# Patient Record
Sex: Male | Born: 1990 | Race: White | Hispanic: No | Smoking: Current every day smoker
Health system: Southern US, Community
[De-identification: ages and names within clinical notes are randomized; demographics above are authoritative.]

## PROBLEM LIST (undated history)

## (undated) DIAGNOSIS — R55 Syncope and collapse: Secondary | ICD-10-CM

## (undated) DIAGNOSIS — G4730 Sleep apnea, unspecified: Secondary | ICD-10-CM

## (undated) HISTORY — DX: Syncope and collapse: R55

## (undated) HISTORY — PX: OTHER SURGICAL HISTORY: SHX169

---
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
## 2010-01-08 ENCOUNTER — Emergency Department: Payer: Self-pay | Admitting: Emergency Medicine

## 2010-04-29 ENCOUNTER — Emergency Department: Payer: Self-pay | Admitting: Emergency Medicine

## 2010-05-20 ENCOUNTER — Emergency Department: Payer: Self-pay | Admitting: Emergency Medicine

## 2011-02-17 ENCOUNTER — Emergency Department: Payer: Self-pay | Admitting: Emergency Medicine

## 2011-03-18 ENCOUNTER — Emergency Department: Payer: Self-pay | Admitting: Emergency Medicine

## 2011-04-04 ENCOUNTER — Emergency Department: Payer: Self-pay | Admitting: Emergency Medicine

## 2011-04-11 ENCOUNTER — Emergency Department: Payer: Self-pay | Admitting: Emergency Medicine

## 2011-04-20 ENCOUNTER — Emergency Department: Payer: Self-pay | Admitting: Emergency Medicine

## 2011-04-26 ENCOUNTER — Inpatient Hospital Stay: Payer: Self-pay | Admitting: Orthopedic Surgery

## 2011-07-23 ENCOUNTER — Emergency Department: Payer: Self-pay | Admitting: Internal Medicine

## 2011-12-12 ENCOUNTER — Emergency Department: Payer: Self-pay | Admitting: Emergency Medicine

## 2012-10-11 ENCOUNTER — Emergency Department: Payer: Self-pay | Admitting: Emergency Medicine

## 2012-11-30 ENCOUNTER — Emergency Department: Payer: Self-pay | Admitting: Emergency Medicine

## 2013-01-20 ENCOUNTER — Emergency Department: Payer: Self-pay | Admitting: Emergency Medicine

## 2013-07-08 ENCOUNTER — Emergency Department: Payer: Self-pay | Admitting: Emergency Medicine

## 2013-07-09 ENCOUNTER — Emergency Department: Payer: Self-pay | Admitting: Emergency Medicine

## 2013-07-11 LAB — BETA STREP CULTURE(ARMC)

## 2013-09-18 ENCOUNTER — Emergency Department: Payer: Self-pay | Admitting: Emergency Medicine

## 2014-03-27 ENCOUNTER — Emergency Department: Payer: Self-pay | Admitting: Emergency Medicine

## 2014-04-28 ENCOUNTER — Emergency Department: Payer: Self-pay | Admitting: Emergency Medicine

## 2014-06-07 ENCOUNTER — Emergency Department: Payer: Self-pay | Admitting: Emergency Medicine

## 2014-07-01 ENCOUNTER — Emergency Department: Payer: Self-pay | Admitting: Emergency Medicine

## 2014-07-14 ENCOUNTER — Ambulatory Visit (INDEPENDENT_AMBULATORY_CARE_PROVIDER_SITE_OTHER): Payer: Self-pay | Admitting: Cardiovascular Disease

## 2014-07-14 ENCOUNTER — Encounter (INDEPENDENT_AMBULATORY_CARE_PROVIDER_SITE_OTHER): Payer: Self-pay

## 2014-07-14 ENCOUNTER — Encounter: Payer: Self-pay | Admitting: Cardiovascular Disease

## 2014-07-14 VITALS — BP 155/83 | HR 78 | Ht 71.0 in | Wt 315.5 lb

## 2014-07-14 DIAGNOSIS — R55 Syncope and collapse: Secondary | ICD-10-CM | POA: Insufficient documentation

## 2014-07-14 NOTE — Assessment & Plan Note (Signed)
The patient's previous episodes of syncope in the past are highly suggestive of vasovagal syncope. However, the most recent episode was sudden and not preceded by any prodrome or triggering factors. Thus, arrhythmia cannot be excluded. I recommend further evaluation with a 48-hour Holter monitor. I also think it's important to exclude structural heart abnormalities and pulmonary hypertension given his obesity and sleep apnea. I requested an echocardiogram for evaluation.  if Cardiac workup is negative, he might need neurology evaluation with at least an EEG.

## 2014-07-14 NOTE — Progress Notes (Signed)
HPI  This is a 23 year old man who is referred from the emergency room at [facility] for evaluation of syncope. He reports having 2 syncopal episodes in the past years ago but both of them were in the setting of significant pain and discomfort and what preceded with dizziness. However, recently he was walking in his bedroom and all of a sudden he lost consciousness for about 1 minute. There was no seizure activity noted by the wife. He had no incontinence or tongue biting. There was no postictal confusion. The episode was sudden and not preceded by any warning. He had no dizziness, chest pain or shortness of breath. [facility] was [facility]. ECG was unremarkable. He has known history of morbid obesity and sleep apnea on CPAP. There is no history of congenital heart disease. He smokes one pack per day. Family history is negative for coronary artery disease, arrhythmia or sudden death.  Allergies  Allergen Reactions  . Tramadol Itching     No current outpatient prescriptions on file prior to visit.   No current facility-administered medications on file prior to visit.     Past Medical History  Diagnosis Date  . Syncope and collapse      History reviewed. No pertinent past surgical history.   Family History  Problem Relation Age of Onset  . Hypertension Mother   . Hypertension Father      History   Social History  . Marital Status: Married    Spouse Name: N/A    Number of Children: N/A  . Years of Education: N/A   Occupational History  . Not on file.   Social History Main Topics  . Smoking status: Current Every Day Smoker -- 1.00 packs/day for 10 years  . Smokeless tobacco: Not on file  . Alcohol Use: Yes  . Drug Use: No  . Sexual Activity: Not on file   Other Topics Concern  . Not on file   Social History Narrative  . No narrative on file     ROS A 10 point review of system was performed. It is negative other than that mentioned in the history  of present illness.   PHYSICAL EXAM   BP 155/83  Pulse 78  Ht 5\' 11"  (1.803 m)  Wt 315 lb 8 oz (143.11 kg)  BMI 44.02 kg/m2 Constitutional: She is oriented to person, place, and time. She appears well-developed and well-nourished. No distress.  HENT: No nasal discharge.  Head: Normocephalic and atraumatic.  Eyes: Pupils are equal and round. No discharge.  Neck: Normal range of motion. Neck supple. No JVD present. No thyromegaly present.  Cardiovascular: Normal rate, regular rhythm, normal heart sounds. Exam reveals no gallop and no friction rub. No murmur heard.  Pulmonary/Chest: Effort normal and breath sounds normal. No stridor. No respiratory distress. She has no wheezes. She has no rales. She exhibits no tenderness.  Abdominal: Soft. Bowel sounds are normal. She exhibits no distension. There is no tenderness. There is no rebound and no guarding.  Musculoskeletal: Normal range of motion. She exhibits no edema and no tenderness.  Neurological: She is alert and oriented to person, place, and time. Coordination normal.  Skin: Skin is warm and dry. No rash noted. She is not diaphoretic. No erythema. No pallor.  Psychiatric: She has a normal mood and affect. Her behavior is normal. Judgment and thought content normal.     ZOX:WRUEAEKG:Sinus  Rhythm  -Nonspecific QRS widening.   BORDERLINE   ASSESSMENT  AND PLAN

## 2014-07-14 NOTE — Patient Instructions (Signed)
Your physician has recommended that you wear a holter monitor. Holter monitors are medical devices that record the heart's electrical activity. Doctors most often use these monitors to diagnose arrhythmias. Arrhythmias are problems with the speed or rhythm of the heartbeat. The monitor is a small, portable device. You can wear one while you do your normal daily activities. This is usually used to diagnose what is causing palpitations/syncope (passing out).   Your physician has requested that you have an echocardiogram. Echocardiography is a painless test that uses sound waves to create images of your heart. It provides your doctor with information about the size and shape of your heart and how well your heart's chambers and valves are working. This procedure takes approximately one hour. There are no restrictions for this procedure.  Your physician recommends that you schedule a follow-up appointment in:  As needed   Your next appointment will be scheduled in our new office located at :  [facility]  [address], [address]  [place], [place] [id number]

## 2014-07-21 ENCOUNTER — Telehealth: Payer: Self-pay | Admitting: Cardiovascular Disease

## 2014-07-21 ENCOUNTER — Other Ambulatory Visit: Payer: Self-pay

## 2014-07-21 ENCOUNTER — Other Ambulatory Visit (INDEPENDENT_AMBULATORY_CARE_PROVIDER_SITE_OTHER): Payer: Self-pay

## 2014-07-21 ENCOUNTER — Telehealth (HOSPITAL_COMMUNITY): Payer: Self-pay | Admitting: *Deleted

## 2014-07-21 DIAGNOSIS — R55 Syncope and collapse: Secondary | ICD-10-CM

## 2014-07-21 NOTE — Telephone Encounter (Signed)
Wanted to know for the PV that will happen nov19th, can he have coffee, please call pt.

## 2014-07-21 NOTE — Telephone Encounter (Signed)
LVM 10/30 

## 2014-07-24 ENCOUNTER — Telehealth: Payer: Self-pay

## 2014-07-24 NOTE — Telephone Encounter (Signed)
error 

## 2014-07-24 NOTE — Telephone Encounter (Signed)
Pt would like to know echo results. Please call.

## 2014-07-24 NOTE — Telephone Encounter (Signed)
See result note 11/02

## 2014-07-28 ENCOUNTER — Other Ambulatory Visit: Payer: Self-pay

## 2014-08-02 ENCOUNTER — Telehealth: Payer: Self-pay | Admitting: *Deleted

## 2014-08-02 NOTE — Telephone Encounter (Signed)
error 

## 2014-08-02 NOTE — Telephone Encounter (Signed)
Patient has not returned call

## 2014-11-23 ENCOUNTER — Telehealth: Payer: Self-pay | Admitting: *Deleted

## 2014-11-23 NOTE — Telephone Encounter (Signed)
Called pt cell phone is disconnected. Called pt home phone no answer/unable to leave voicemail to verify holter monitor placement.

## 2015-02-01 ENCOUNTER — Emergency Department
Admission: EM | Admit: 2015-02-01 | Discharge: 2015-02-01 | Disposition: A | Discharge status: Home / Self Care | Payer: Self-pay | Attending: Student | Admitting: Student

## 2015-02-01 ENCOUNTER — Encounter: Payer: Self-pay | Admitting: Emergency Medicine

## 2015-02-01 DIAGNOSIS — R252 Cramp and spasm: Secondary | ICD-10-CM | POA: Insufficient documentation

## 2015-02-01 DIAGNOSIS — R1084 Generalized abdominal pain: Secondary | ICD-10-CM | POA: Insufficient documentation

## 2015-02-01 DIAGNOSIS — Z79899 Other long term (current) drug therapy: Secondary | ICD-10-CM | POA: Insufficient documentation

## 2015-02-01 DIAGNOSIS — Z720 Tobacco use: Secondary | ICD-10-CM | POA: Insufficient documentation

## 2015-02-01 DIAGNOSIS — G8929 Other chronic pain: Secondary | ICD-10-CM | POA: Insufficient documentation

## 2015-02-01 HISTORY — DX: Sleep apnea, unspecified: G47.30

## 2015-02-01 LAB — URINALYSIS COMPLETE WITH MICROSCOPIC (ARMC ONLY)
BACTERIA UA: NONE SEEN
Bilirubin Urine: NEGATIVE
GLUCOSE, UA: NEGATIVE mg/dL
Hgb urine dipstick: NEGATIVE
Ketones, ur: NEGATIVE mg/dL
Leukocytes, UA: NEGATIVE
Nitrite: NEGATIVE
PROTEIN: NEGATIVE mg/dL
SPECIFIC GRAVITY, URINE: 1.013 (ref 1.005–1.030)
Squamous Epithelial / LPF: NONE SEEN
pH: 6 (ref 5.0–8.0)

## 2015-02-01 LAB — COMPREHENSIVE METABOLIC PANEL
ALBUMIN: 4.4 g/dL (ref 3.5–5.0)
ALT: 93 U/L — AB (ref 17–63)
AST: 50 U/L — AB (ref 15–41)
Alkaline Phosphatase: 61 U/L (ref 38–126)
Anion gap: 8 (ref 5–15)
BUN: 16 mg/dL (ref 6–20)
CALCIUM: 8.9 mg/dL (ref 8.9–10.3)
CO2: 26 mmol/L (ref 22–32)
Chloride: 103 mmol/L (ref 101–111)
Creatinine, Ser: 0.93 mg/dL (ref 0.61–1.24)
GFR calc Af Amer: >60 mL/min (ref >60)
Glucose, Bld: 120 mg/dL — ABNORMAL HIGH (ref 65–99)
Potassium: 4.2 mmol/L (ref 3.5–5.1)
SODIUM: 137 mmol/L (ref 135–145)
Total Bilirubin: 1.2 mg/dL (ref 0.3–1.2)
Total Protein: 7.6 g/dL (ref 6.5–8.1)

## 2015-02-01 LAB — CBC WITH DIFFERENTIAL/PLATELET
Basophils Absolute: 0 10*3/uL (ref 0–0.1)
Basophils Relative: 0 %
EOS PCT: 3 %
Eosinophils Absolute: 0.2 10*3/uL (ref 0–0.7)
HCT: 42.5 % (ref 40.0–52.0)
Hemoglobin: 15 g/dL (ref 13.0–18.0)
LYMPHS ABS: 2.4 10*3/uL (ref 1.0–3.6)
Lymphocytes Relative: 31 %
MCH: 29.3 pg (ref 26.0–34.0)
MCHC: 35.2 g/dL (ref 32.0–36.0)
MCV: 83.3 fL (ref 80.0–100.0)
MONO ABS: 0.7 10*3/uL (ref 0.2–1.0)
MONOS PCT: 9 %
NEUTROS PCT: 57 %
Neutro Abs: 4.3 10*3/uL (ref 1.4–6.5)
PLATELETS: 289 10*3/uL (ref 150–440)
RBC: 5.11 MIL/uL (ref 4.40–5.90)
RDW: 13 % (ref 11.5–14.5)
WBC: 7.6 10*3/uL (ref 3.8–10.6)

## 2015-02-01 LAB — CK: Total CK: 711 U/L — ABNORMAL HIGH (ref 49–397)

## 2015-02-01 LAB — LIPASE, BLOOD: LIPASE: 27 U/L (ref 22–51)

## 2015-02-01 MED ORDER — DIAZEPAM 5 MG PO TABS
5.0000 mg | ORAL_TABLET | Freq: Once | ORAL | Status: AC
  Administered 2015-02-01: 5 mg via ORAL

## 2015-02-01 MED ORDER — SODIUM CHLORIDE 0.9 % IV BOLUS (SEPSIS)
1000.0000 mL | Freq: Once | INTRAVENOUS | Status: AC
  Administered 2015-02-01: 1000 mL via INTRAVENOUS

## 2015-02-01 MED ORDER — DIAZEPAM 5 MG PO TABS
ORAL_TABLET | ORAL | Status: AC
  Administered 2015-02-01: 5 mg via ORAL
  Filled 2015-02-01: qty 1

## 2015-02-01 MED ORDER — CYCLOBENZAPRINE HCL 5 MG PO TABS: 5.0000 mg | ORAL_TABLET | Freq: Three times a day (TID) | ORAL | Status: AC | PRN

## 2015-02-01 NOTE — Discharge Instructions (Signed)
Abdominal Pain Many things can cause belly (abdominal) pain. Most times, the belly pain is not dangerous. Many cases of belly pain can be watched and treated at home. HOME CARE   Do not take medicines that help you go poop (laxatives) unless told to by your doctor.  Only take medicine as told by your doctor.  Eat or drink as told by your doctor. Your doctor will tell you if you should be on a special diet. GET HELP IF:  You do not know what is causing your belly pain.  You have belly pain while you are sick to your stomach (nauseous) or have runny poop (diarrhea).  You have pain while you pee or poop.  Your belly pain wakes you up at night.  You have belly pain that gets worse or better when you eat.  You have belly pain that gets worse when you eat fatty foods.  You have a fever. GET HELP RIGHT AWAY IF:   The pain does not go away within 2 hours.  You keep throwing up (vomiting).  The pain changes and is only in the right or left part of the belly.  You have bloody or tarry looking poop. MAKE SURE YOU:   Understand these instructions.  Will watch your condition.  Will get help right away if you are not doing well or get worse. Document Released: 02/25/2008 Document Revised: 09/13/2013 Document Reviewed: 05/18/2013 [facility] Patient Information 2015 [place]. This information is not intended to replace advice given to you by your health care provider. Make sure you discuss any questions you have with your health care provider.  Muscle Cramps and Spasms Muscle cramps and spasms occur when a muscle or muscles tighten and you have no control over this tightening (involuntary muscle contraction). They are a common problem and can develop in any muscle. The most common place is in the calf muscles of the leg. Both muscle cramps and muscle spasms are involuntary muscle contractions, but they also have differences:   Muscle cramps are sporadic and painful. They may last a  few seconds to a quarter of an hour. Muscle cramps are often more forceful and last longer than muscle spasms.  Muscle spasms may or may not be painful. They may also last just a few seconds or much longer. CAUSES  It is uncommon for cramps or spasms to be due to a serious underlying problem. In many cases, the cause of cramps or spasms is unknown. Some common causes are:   Overexertion.   Overuse from repetitive motions (doing the same thing over and over).   Remaining in a certain position for a long period of time.   Improper preparation, form, or technique while performing a sport or activity.   Dehydration.   Injury.   Side effects of some medicines.   Abnormally low levels of the salts and ions in your blood (electrolytes), especially potassium and calcium. This could happen if you are taking water pills (diuretics) or you are pregnant.  Some underlying medical problems can make it more likely to develop cramps or spasms. These include, but are not limited to:   Diabetes.   Parkinson disease.   Hormone disorders, such as thyroid problems.   Alcohol abuse.   Diseases specific to muscles, joints, and bones.   Blood vessel disease where not enough blood is getting to the muscles.  HOME CARE INSTRUCTIONS   Stay well hydrated. Drink enough water and fluids to keep your urine clear or  pale yellow.  It may be helpful to massage, stretch, and relax the affected muscle.  For tight or tense muscles, use a warm towel, heating pad, or hot shower water directed to the affected area.  If you are sore or have pain after a cramp or spasm, applying ice to the affected area may relieve discomfort.  Put ice in a plastic bag.  Place a towel between your skin and the bag.  Leave the ice on for 15-20 minutes, 03-04 times a day.  Medicines used to treat a known cause of cramps or spasms may help reduce their frequency or severity. Only take over-the-counter or  prescription medicines as directed by your caregiver. SEEK MEDICAL CARE IF:  Your cramps or spasms get more severe, more frequent, or do not improve over time.  MAKE SURE YOU:   Understand these instructions.  Will watch your condition.  Will get help right away if you are not doing well or get worse. Document Released: 02/28/2002 Document Revised: 01/03/2013 Document Reviewed: 08/25/2012 [facility] Patient Information 2015 [place]. This information is not intended to replace advice given to you by your health care provider. Make sure you discuss any questions you have with your health care provider.

## 2015-02-01 NOTE — ED Provider Notes (Signed)
[facility] Emergency Department Provider Note  ____________________________________________  Time seen: Approximately 9:44 AM  I have reviewed the triage vital signs and the nursing notes.   HISTORY  Chief Complaint Muscle Pain    HPI William Dowaul R Baskett Jr. is a 24 y.o. male with history of obstructive sleep apnea, hypokalemic, who presents for evaluation of acute on chronic abdominal and back cramps. He reports he's had these "my whole life" however they've become more severe over the past 2-3 days. He reports that in the past it has been due to low potassium. Typically the cramps are in the lower abdomen and when he "stretches" they resolve. They've been intermittent. Current severity 5 out of 10. No recent medication changes. He does sometimes engage in strenuous activity for work. He denies any recent illness including no cough, sneezing, runny nose, congestion, nausea, vomiting, diarrhea.   Past Medical History  Diagnosis Date  . Syncope and collapse   . Sleep apnea     Patient Active Problem List   Diagnosis Date Noted  . Faintness 07/14/2014    Past Surgical History  Procedure Laterality Date  . Denies      Current Outpatient Rx  Name  Route  Sig  Dispense  Refill  . Multiple Vitamin (MULTIVITAMIN WITH MINERALS) TABS tablet   Oral   Take 1 tablet by mouth daily.         Marland Kitchen. POTASSIUM PO   Oral   Take 1 tablet by mouth daily.           Allergies Bee venom and Tramadol  Family History  Problem Relation Age of Onset  . Hypertension Mother   . Hypertension Father     Social History History  Substance Use Topics  . Smoking status: Current Every Day Smoker -- 1.00 packs/day for 10 years  . Smokeless tobacco: Not on file  . Alcohol Use: Yes    Review of Systems Constitutional: No fever/chills Eyes: No visual changes. ENT: No sore throat. Cardiovascular: Denies chest pain. Respiratory: Denies shortness of  breath. Gastrointestinal: + abdominal pain.  No nausea, no vomiting.  No diarrhea.  No constipation. Genitourinary: Negative for dysuria. Musculoskeletal: + for back pain. Skin: Negative for rash. Neurological: Negative for headaches, focal weakness or numbness.  10-point ROS otherwise negative.  ____________________________________________   PHYSICAL EXAM:  VITAL SIGNS: ED Triage Vitals  Enc Vitals Group     BP 02/01/15 0756 176/93 mmHg     Pulse Rate 02/01/15 0756 92     Resp 02/01/15 0756 20     Temp 02/01/15 0756 98.1 F (36.7 C)     Temp Source 02/01/15 0756 Oral     SpO2 02/01/15 0756 100 %     Weight --      Height --      Head Cir --      Peak Flow --      Pain Score --      Pain Loc --      Pain Edu? --      Excl. in GC? --     Constitutional: Alert and oriented. Well appearing and in no acute distress. Eyes: Conjunctivae are normal. PERRL. EOMI. Head: Atraumatic. Nose: No congestion/rhinnorhea. Mouth/Throat: Mucous membranes are moist.  Oropharynx non-erythematous. Neck: No stridor. Cardiovascular: Normal rate, regular rhythm. Grossly normal heart sounds.  Good peripheral circulation. Respiratory: Normal respiratory effort.  No retractions. Lungs CTAB. Gastrointestinal: Soft and nontender. No distention. No abdominal bruits. No CVA tenderness.  Genitourinary: deferred Musculoskeletal: No lower extremity tenderness nor edema.  No joint effusions. Neurologic:  Normal speech and language. No gross focal neurologic deficits are appreciated. Speech is normal. No gait instability. Skin:  Skin is warm, dry and intact. No rash noted. Psychiatric: Mood and affect are normal. Speech and behavior are normal.  ____________________________________________   LABS (all labs ordered are listed, but only abnormal results are displayed)  Labs Reviewed  URINALYSIS COMPLETEWITH MICROSCOPIC ([facility])  - Abnormal; Notable for the following:    Color, Urine YELLOW (*)     APPearance CLEAR (*)    All other components within normal limits  COMPREHENSIVE METABOLIC PANEL - Abnormal; Notable for the following:    Glucose, Bld 120 (*)    AST 50 (*)    ALT 93 (*)    All other components within normal limits  CK - Abnormal; Notable for the following:    Total CK 711 (*)    All other components within normal limits  CBC WITH DIFFERENTIAL/PLATELET  LIPASE, BLOOD   ____________________________________________  EKG  none ____________________________________________  RADIOLOGY  none ____________________________________________   PROCEDURES  Procedure(s) performed: None  Critical Care performed: No  ____________________________________________   INITIAL IMPRESSION / ASSESSMENT AND PLAN / ED COURSE  Pertinent labs & imaging results that were available during my care of the patient were reviewed by me and considered in my medical decision making (see chart for details).  William Dowaul R Oleski Jr. is a 24 y.o. male with history of obstructive sleep apnea, hypokalemic, who presents for evaluation of acute on chronic abdominal and back cramps. On exam, he is very well-appearing and in no acute distress. Vital signs stable, afebrile. Blood pressure elevated 176/93 but he is asymptomatic. He has a completely benign abdominal exam. Labs are only remarkable for CK elevation but creatinine is normal. Potassium within normal limits. He feels better after fluids and Valium. We'll discharge with Flexeril, instructions to push by mouth fluids, PCP follow-up. He is comfortable with discharge plan. ____________________________________________   FINAL CLINICAL IMPRESSION(S) / ED DIAGNOSES  Final diagnoses:  Muscle cramps  Generalized abdominal pain      Gayla DossEryka A Neviah Braud, MD 02/01/15 1128

## 2015-02-01 NOTE — ED Notes (Signed)
States he is having right  Sided abd cramping for the past 2 days . denies any n/v /d or fever.The patient has a history of low potassium.

## 2015-03-29 ENCOUNTER — Encounter: Payer: Self-pay | Admitting: Urgent Care

## 2015-03-29 ENCOUNTER — Emergency Department
Admission: EM | Admit: 2015-03-29 | Discharge: 2015-03-29 | Disposition: A | Discharge status: Home / Self Care | Payer: Self-pay | Attending: Emergency Medicine | Admitting: Emergency Medicine

## 2015-03-29 DIAGNOSIS — Z720 Tobacco use: Secondary | ICD-10-CM | POA: Insufficient documentation

## 2015-03-29 DIAGNOSIS — H6091 Unspecified otitis externa, right ear: Secondary | ICD-10-CM

## 2015-03-29 DIAGNOSIS — H60501 Unspecified acute noninfective otitis externa, right ear: Secondary | ICD-10-CM | POA: Insufficient documentation

## 2015-03-29 DIAGNOSIS — Z79899 Other long term (current) drug therapy: Secondary | ICD-10-CM | POA: Insufficient documentation

## 2015-03-29 MED ORDER — HYDROCODONE-ACETAMINOPHEN 5-325 MG PO TABS: 1.0000 | ORAL_TABLET | ORAL | Status: AC | PRN

## 2015-03-29 MED ORDER — AMOXICILLIN 500 MG PO CAPS: 500.0000 mg | ORAL_CAPSULE | Freq: Three times a day (TID) | ORAL | Status: AC

## 2015-03-29 MED ORDER — NEOMYCIN-POLYMYXIN-HC 3.5-10000-1 OT SOLN: 3.0000 [drp] | Freq: Three times a day (TID) | OTIC | Status: AC

## 2015-03-29 NOTE — ED Notes (Signed)
Patient presents with c/o RIGHT otalgia x 4 hours. Wears CPAP  - reports that strap made it unbearable and presented him form sleeping. NOS reported at this time.

## 2015-03-29 NOTE — ED Provider Notes (Signed)
[facility] Emergency Department Provider Note  ____________________________________________  Time seen: 2745 I have reviewed the triage vital signs and the nursing notes.   HISTORY  Chief Complaint Otalgia   HPI William Dowaul R Pohlman Jr. is a 24 y.o. male states his right ear has been hurting for 4 hours prior to his arrival to the emergency room. He states he wears a CPAP machine and the strap last night was putting pressure on his ear which increased his pain causing him not to be able to sleep. He is unaware of any fever or chills. He denies any cough, congestion or runny nose.  He has not taken any over-the-counter medication for his pain. He denies any discharge from his ear. And he has not put anything in his ear. Currently rates his pain as 6/10.   Past Medical History  Diagnosis Date  . Syncope and collapse   . Sleep apnea     Patient Active Problem List   Diagnosis Date Noted  . Faintness 07/14/2014    Past Surgical History  Procedure Laterality Date  . Denies      Current Outpatient Rx  Name  Route  Sig  Dispense  Refill  . amoxicillin (AMOXIL) 500 MG capsule   Oral   Take 1 capsule (500 mg total) by mouth 3 (three) times daily.   30 capsule   0   . cyclobenzaprine (FLEXERIL) 5 MG tablet   Oral   Take 1 tablet (5 mg total) by mouth 3 (three) times daily as needed for muscle spasms (Do NOT drive while taking this medication).   15 tablet   0   . HYDROcodone-acetaminophen (NORCO/VICODIN) 5-325 MG per tablet   Oral   Take 1 tablet by mouth every 4 (four) hours as needed for moderate pain.   15 tablet   0   . Multiple Vitamin (MULTIVITAMIN WITH MINERALS) TABS tablet   Oral   Take 1 tablet by mouth daily.         Marland Kitchen. neomycin-polymyxin-hydrocortisone (CORTISPORIN) otic solution   Right Ear   Place 3 drops into the right ear 3 (three) times daily.   10 mL   0   . POTASSIUM PO   Oral   Take 1 tablet by mouth daily.            Allergies Bee venom and Tramadol  Family History  Problem Relation Age of Onset  . Hypertension Mother   . Hypertension Father     Social History History  Substance Use Topics  . Smoking status: Current Every Day Smoker -- 1.00 packs/day for 10 years  . Smokeless tobacco: Not on file  . Alcohol Use: Yes    Review of Systems Constitutional: No fever/chills Eyes: No visual changes. ENT: No sore throat. Cardiovascular: Denies chest pain. Respiratory: Denies shortness of breath. Gastrointestinal: No abdominal pain.  No nausea, no vomiting.   Genitourinary: Negative for dysuria. Musculoskeletal: Negative for back pain. Skin: Negative for rash. Neurological: Negative for headaches  10-point ROS otherwise negative.  ____________________________________________   PHYSICAL EXAM:  VITAL SIGNS: ED Triage Vitals  Enc Vitals Group     BP 03/29/15 0351 141/67 mmHg     Pulse Rate 03/29/15 0351 76     Resp 03/29/15 0351 18     Temp 03/29/15 0351 98.3 F (36.8 C)     Temp Source 03/29/15 0351 Oral     SpO2 03/29/15 0351 95 %     Weight 03/29/15 0351 325  lb (147.419 kg)     Height 03/29/15 0351  (1.803 m)     Head Cir --      Peak Flow --      Pain Score 03/29/15 0352 6     Pain Loc --      Pain Edu? --      Excl. in GC? --     Constitutional: Alert and oriented. Well appearing and in no acute distress. Eyes: Conjunctivae are normal. PERRL. EOMI. Head: Atraumatic. Nose: No congestion/rhinnorhea. Left EAC and TM clear. Right EAC extremely tender to touch with minimal exudate present. TM is slightly pink. No foreign body was noted Mouth/Throat: Mucous membranes are moist.  Oropharynx non-erythematous. Neck: No stridor.  Supple Hematological/Lymphatic/Immunilogical: No cervical lymphadenopathy. Cardiovascular: Normal rate, regular rhythm. Grossly normal heart sounds.  Good peripheral circulation. Respiratory: Normal respiratory effort.  No retractions. Lungs  CTAB. Gastrointestinal: Soft and nontender. No distention.  Musculoskeletal: No lower extremity tenderness nor edema.  No joint effusions. Neurologic:  Normal speech and language. No gross focal neurologic deficits are appreciated. Speech is normal. No gait instability. Skin:  Skin is warm, dry and intact. No rash noted. Psychiatric: Mood and affect are normal. Speech and behavior are normal.  ____________________________________________   LABS (all labs ordered are listed, but only abnormal results are displayed)  Labs Reviewed - No data to display   PROCEDURES  Procedure(s) performed: None  Critical Care performed: No  ____________________________________________   INITIAL IMPRESSION / ASSESSMENT AND PLAN / ED COURSE  Pertinent labs & imaging results that were available during my care of the patient were reviewed by me and considered in my medical decision making (see chart for details).  She is given prescription for amoxicillin for 10 days along with Norco as needed for pain and Cortisporin otic suspension. He is follow-up with his doctor at [facility] clinic if any continued problems. ____________________________________________   FINAL CLINICAL IMPRESSION(S) / ED DIAGNOSES  Final diagnoses:  Otitis externa, acute, right      William Rumps, PA-C 03/29/15 1005  William Cheek, MD 03/29/15 1536

## 2015-08-17 DIAGNOSIS — Y9389 Activity, other specified: Secondary | ICD-10-CM | POA: Insufficient documentation

## 2015-08-17 DIAGNOSIS — Z792 Long term (current) use of antibiotics: Secondary | ICD-10-CM | POA: Insufficient documentation

## 2015-08-17 DIAGNOSIS — Y998 Other external cause status: Secondary | ICD-10-CM | POA: Insufficient documentation

## 2015-08-17 DIAGNOSIS — W228XXA Striking against or struck by other objects, initial encounter: Secondary | ICD-10-CM | POA: Insufficient documentation

## 2015-08-17 DIAGNOSIS — Y9289 Other specified places as the place of occurrence of the external cause: Secondary | ICD-10-CM | POA: Insufficient documentation

## 2015-08-17 DIAGNOSIS — F17200 Nicotine dependence, unspecified, uncomplicated: Secondary | ICD-10-CM | POA: Insufficient documentation

## 2015-08-17 DIAGNOSIS — Z79899 Other long term (current) drug therapy: Secondary | ICD-10-CM | POA: Insufficient documentation

## 2015-08-17 DIAGNOSIS — S0501XA Injury of conjunctiva and corneal abrasion without foreign body, right eye, initial encounter: Secondary | ICD-10-CM | POA: Insufficient documentation

## 2015-08-17 NOTE — ED Notes (Signed)
Patient reports helping change a tire and it "blew up" in his face.  Reports wash eye out.  Report still feels like something is behind his right eye.

## 2015-08-18 ENCOUNTER — Emergency Department
Admission: EM | Admit: 2015-08-18 | Discharge: 2015-08-18 | Disposition: A | Discharge status: Home / Self Care | Payer: Self-pay | Attending: Emergency Medicine | Admitting: Emergency Medicine

## 2015-08-18 ENCOUNTER — Emergency Department: Payer: Self-pay

## 2015-08-18 DIAGNOSIS — S0501XA Injury of conjunctiva and corneal abrasion without foreign body, right eye, initial encounter: Secondary | ICD-10-CM

## 2015-08-18 DIAGNOSIS — H5711 Ocular pain, right eye: Secondary | ICD-10-CM

## 2015-08-18 MED ORDER — TETRACAINE HCL 0.5 % OP SOLN
OPHTHALMIC | Status: AC
  Filled 2015-08-18: qty 2

## 2015-08-18 MED ORDER — ERYTHROMYCIN 5 MG/GM OP OINT
TOPICAL_OINTMENT | Freq: Once | OPHTHALMIC | Status: AC
  Administered 2015-08-18: 1 via OPHTHALMIC
  Filled 2015-08-18: qty 1

## 2015-08-18 MED ORDER — OXYCODONE-ACETAMINOPHEN 5-325 MG PO TABS
2.0000 | ORAL_TABLET | Freq: Once | ORAL | Status: AC
  Administered 2015-08-18: 2 via ORAL
  Filled 2015-08-18: qty 2

## 2015-08-18 MED ORDER — FLUORESCEIN SODIUM 1 MG OP STRP
ORAL_STRIP | OPHTHALMIC | Status: AC
  Filled 2015-08-18: qty 1

## 2015-08-18 MED ORDER — TETRACAINE HCL 0.5 % OP SOLN: 1.0000 [drp] | Freq: Once | OPHTHALMIC | Status: DC

## 2015-08-18 MED ORDER — FLUORESCEIN SODIUM 1 MG OP STRP
1.0000 | ORAL_STRIP | Freq: Once | OPHTHALMIC | Status: DC
  Filled 2015-08-18: qty 1

## 2015-08-18 NOTE — Discharge Instructions (Signed)
Overall, you're examining the emergency department looks okay. You had a minimal abrasion at approximately the 4:00 position on your eye. The CT scan of your eyes looked okay with no foreign bodies and no bleeding in the area.  Further exam is recommended. Please contact [facility] or call back to the emergency department to help arrange this follow-up on Saturday morning. We will call Dr. Druscilla BrowniePorfilio to help arrange this as well. This often happens at approximately 8:30 or 9:00 in the morning. Please keep a telephone on so that we can contact you with an updated time. Return to the emergency department if you have worsening pain, worsening vision, or if you have other urgent concerns.  Corneal Abrasion The cornea is the clear covering at the front and center of the eye. When looking at the colored portion of the eye (iris), you are looking through the cornea. This very thin tissue is made up of many layers. The surface layer is a single layer of cells (corneal epithelium) and is one of the most sensitive tissues in the body. If a scratch or injury causes the corneal epithelium to come off, it is called a corneal abrasion. If the injury extends to the tissues below the epithelium, the condition is called a corneal ulcer. CAUSES   Scratches.  Trauma.  Foreign body in the eye. Some people have recurrences of abrasions in the area of the original injury even after it has healed (recurrent erosion syndrome). Recurrent erosion syndrome generally improves and goes away with time. SYMPTOMS   Eye pain.  Difficulty or inability to keep the injured eye open.  The eye becomes very sensitive to light.  Recurrent erosions tend to happen suddenly, first thing in the morning, usually after waking up and opening the eye. DIAGNOSIS  Your health care provider can diagnose a corneal abrasion during an eye exam. Dye is usually placed in the eye using a drop or a small paper strip moistened by your tears.  When the eye is examined with a special light, the abrasion shows up clearly because of the dye. TREATMENT   Small abrasions may be treated with antibiotic drops or ointment alone.  A pressure patch may be put over the eye. If this is done, follow your doctor's instructions for when to remove the patch. Do not drive or use machines while the eye patch is on. Judging distances is hard to do with a patch on. If the abrasion becomes infected and spreads to the deeper tissues of the cornea, a corneal ulcer can result. This is serious because it can cause corneal scarring. Corneal scars interfere with light passing through the cornea and cause a loss of vision in the involved eye. HOME CARE INSTRUCTIONS  Use medicine or ointment as directed. Only take over-the-counter or prescription medicines for pain, discomfort, or fever as directed by your health care provider.  Do not drive or operate machinery if your eye is patched. Your ability to judge distances is impaired.  If your health care provider has given you a follow-up appointment, it is very important to keep that appointment. Not keeping the appointment could result in a severe eye infection or permanent loss of vision. If there is any problem keeping the appointment, let your health care provider know. SEEK MEDICAL CARE IF:   You have pain, light sensitivity, and a scratchy feeling in one eye or both eyes.  Your pressure patch keeps loosening up, and you can blink your eye under the patch  after treatment.  Any kind of discharge develops from the eye after treatment or if the lids stick together in the morning.  You have the same symptoms in the morning as you did with the original abrasion days, weeks, or months after the abrasion healed.   This information is not intended to replace advice given to you by your health care provider. Make sure you discuss any questions you have with your health care provider.   Document Released: 09/05/2000  Document Revised: 05/30/2015 Document Reviewed: 05/16/2013 Elsevier Interactive Patient Education [employer]

## 2015-08-18 NOTE — ED Notes (Signed)
During discharge, pt was under the impression he was getting an abx eyedrop prescription.  Inquiring with Carollee MassedKaminski.

## 2015-08-18 NOTE — ED Notes (Addendum)
Pt states he did wash out his eyes with water first then he did it again with 98% "purified water".  He feels like there is something behind his eye, he says it feels like "pressure" behind his eye building up.

## 2015-08-18 NOTE — ED Provider Notes (Signed)
[facility] Emergency Department Provider Note  ____________________________________________  Time seen: 223  I have reviewed the triage vital signs and the nursing notes.  History by:  Patient  HISTORY  Chief Complaint Eye Pain and Foreign Body in Eye     HPI William Dowaul R Cummings Jr. is a 24 y.o. male who was working with a pickup tire earlier this evening. He reports the tire exploded and he fell he had debris hit his right eye. He continues to have ongoing eye discomfort. He reports some blurry vision, but cites that he thinks that is due to the increased watering. He did wash his eye out but feels that the discomfort is continuing and he reports he has a pressure in the eye.    Past Medical History  Diagnosis Date  . Syncope and collapse   . Sleep apnea     Patient Active Problem List   Diagnosis Date Noted  . Faintness 07/14/2014    Past Surgical History  Procedure Laterality Date  . Denies      Current Outpatient Rx  Name  Route  Sig  Dispense  Refill  . amoxicillin (AMOXIL) 500 MG capsule   Oral   Take 1 capsule (500 mg total) by mouth 3 (three) times daily.   30 capsule   0   . cyclobenzaprine (FLEXERIL) 5 MG tablet   Oral   Take 1 tablet (5 mg total) by mouth 3 (three) times daily as needed for muscle spasms (Do NOT drive while taking this medication).   15 tablet   0   . HYDROcodone-acetaminophen (NORCO/VICODIN) 5-325 MG per tablet   Oral   Take 1 tablet by mouth every 4 (four) hours as needed for moderate pain.   15 tablet   0   . Multiple Vitamin (MULTIVITAMIN WITH MINERALS) TABS tablet   Oral   Take 1 tablet by mouth daily.         Marland Kitchen. POTASSIUM PO   Oral   Take 1 tablet by mouth daily.           Allergies Bee venom and Tramadol  Family History  Problem Relation Age of Onset  . Hypertension Mother   . Hypertension Father     Social History Social History  Substance Use Topics  . Smoking status: Current  Every Day Smoker -- 1.00 packs/day for 10 years  . Smokeless tobacco: Not on file  . Alcohol Use: Yes    Review of Systems  Constitutional: Negative for fever/chills. ENT: Eye injury. She history of present illness. Cardiovascular: Negative for chest pain. Respiratory: Negative for cough. Gastrointestinal: Negative for abdominal pain, vomiting and diarrhea. Genitourinary: Negative for dysuria. Musculoskeletal: No myalgias or injuries. Skin: Negative for rash. Neurological: Negative for headache or focal weakness   10-point ROS otherwise negative.  ____________________________________________   PHYSICAL EXAM:  VITAL SIGNS: ED Triage Vitals  Enc Vitals Group     BP 08/17/15 2339 146/68 mmHg     Pulse Rate 08/17/15 2339 86     Resp 08/17/15 2339 20     Temp 08/17/15 2339 97.5 F (36.4 C)     Temp Source 08/17/15 2339 Oral     SpO2 08/17/15 2339 97 %     Weight 08/17/15 2339 325 lb (147.419 kg)     Height 08/17/15 2339 5\' 11"  (1.803 m)     Head Cir --      Peak Flow --      Pain Score 08/17/15 2337  7     Pain Loc --      Pain Edu? --      Excl. in GC? --     Constitutional: Alert and oriented. Patient appears a little comfortable due to his right eye, but no acute distress. ENT   Head: Normocephalic and atraumatic.   Nose: No congestion/rhinnorhea.       Mouth: No erythema, no swelling        Eye: Minimal injection with mild tearing. No deformity. No strabismus. No hyphema noted.      Lamp exam with florescence shows minimal abrasion at the 4:00 position just under the iris.       ocular movement appears normal. Cardiovascular: Normal rate, regular rhythm, no murmur noted Respiratory:  Normal respiratory effort, no tachypnea.    Breath sounds are clear and equal bilaterally.  Gastrointestinal: Soft and nontender. No distention.  Musculoskeletal: No deformity noted. Nontender with normal range of motion in all extremities.  No noted edema. Neurologic:   Communicative. Normal appearing spontaneous movement in all 4 extremities. No gross focal neurologic deficits are appreciated.  Skin:  Skin is warm, dry. No rash noted. Psychiatric: Mood and affect are normal. Speech and behavior are normal.  ____________________________________________  Radiology:  CT orbits:  IMPRESSION: Orbital structures appear intact and symmetrical. No radiopaque foreign bodies demonstrated. ____________________________________________   PROCEDURES  Slit lamp exam: Due to the patient's mechanism injury, with tire exploding and causing eye pain, a slit-lamp exam was performed.  The patient was pretreated with tetracaine. This helped control the pain he was having. On exam with slit-lamp, there was a minimal defect noted at approximately the 4:00 position. Fluoresceined eye was then added. No additional ulcerations were seen.  ____________________________________________   INITIAL IMPRESSION / ASSESSMENT AND PLAN / ED COURSE  Pertinent labs & imaging results that were available during my care of the patient were reviewed by me and considered in my medical decision making (see chart for details).  Communicative, overall well-appearing, 24 year old male with some discomfort from the tire exploding and affecting his right eye. Slit lamp exam shows minimal abrasion that likely does not fully explain the patient's discomfort. Given the explosive nature of this event, we will perform a CT scan to be sure that he does not have a foreign body present and that he does not have any bleeding around the right eye. If this all looks normal, the patient appears stable and can be discharged for close follow-up with ophthalmology.   ----------------------------------------- 4:18 AM on 08/18/2015 -----------------------------------------  CT does not show any area of bleeding and does not see any radiopaque foreign bodies. We will discharge the patient follow-up with [facility].  ____________________________________________   FINAL CLINICAL IMPRESSION(S) / ED DIAGNOSES  Final diagnoses:  Acute right eye pain  Corneal abrasion, right, initial encounter      Darien Ramus, MD 08/18/15 0425

## 2015-08-18 NOTE — ED Notes (Signed)
Patient transported to CT 

## 2017-03-22 ENCOUNTER — Emergency Department: Payer: Self-pay

## 2017-03-22 ENCOUNTER — Emergency Department
Admission: EM | Admit: 2017-03-22 | Discharge: 2017-03-22 | Disposition: A | Discharge status: Home / Self Care | Payer: Self-pay | Attending: Student in an Organized Health Care Education/Training Program | Admitting: Student in an Organized Health Care Education/Training Program

## 2017-03-22 DIAGNOSIS — Y9301 Activity, walking, marching and hiking: Secondary | ICD-10-CM | POA: Insufficient documentation

## 2017-03-22 DIAGNOSIS — Y9289 Other specified places as the place of occurrence of the external cause: Secondary | ICD-10-CM | POA: Insufficient documentation

## 2017-03-22 DIAGNOSIS — Y999 Unspecified external cause status: Secondary | ICD-10-CM | POA: Insufficient documentation

## 2017-03-22 DIAGNOSIS — W458XXA Other foreign body or object entering through skin, initial encounter: Secondary | ICD-10-CM | POA: Insufficient documentation

## 2017-03-22 DIAGNOSIS — Z79899 Other long term (current) drug therapy: Secondary | ICD-10-CM | POA: Insufficient documentation

## 2017-03-22 DIAGNOSIS — Z23 Encounter for immunization: Secondary | ICD-10-CM | POA: Insufficient documentation

## 2017-03-22 DIAGNOSIS — F17210 Nicotine dependence, cigarettes, uncomplicated: Secondary | ICD-10-CM | POA: Insufficient documentation

## 2017-03-22 DIAGNOSIS — S91332A Puncture wound without foreign body, left foot, initial encounter: Secondary | ICD-10-CM | POA: Insufficient documentation

## 2017-03-22 MED ORDER — AMOXICILLIN-POT CLAVULANATE 875-125 MG PO TABS: 1.0000 | ORAL_TABLET | Freq: Two times a day (BID) | ORAL | 0 refills | Status: AC

## 2017-03-22 MED ORDER — TETANUS-DIPHTH-ACELL PERTUSSIS 5-2.5-18.5 LF-MCG/0.5 IM SUSP
0.5000 mL | Freq: Once | INTRAMUSCULAR | Status: AC
  Administered 2017-03-22: 0.5 mL via INTRAMUSCULAR
  Filled 2017-03-22: qty 0.5

## 2017-03-22 MED ORDER — AMOXICILLIN-POT CLAVULANATE 875-125 MG PO TABS
1.0000 | ORAL_TABLET | Freq: Once | ORAL | Status: AC
  Administered 2017-03-22: 1 via ORAL
  Filled 2017-03-22: qty 1

## 2017-03-22 NOTE — ED Triage Notes (Signed)
Patient reports stepped on a screw with left foot, now with pain while ambulating.

## 2017-03-22 NOTE — ED Provider Notes (Signed)
[facility] Emergency Department Provider Note  ____________________________________________  Time seen: Approximately 9:33 PM  I have reviewed the triage vital signs and the nursing notes.   HISTORY  Chief Complaint Foot Pain    HPI William Rogers. is a 26 y.o. male who presents to emergency department complaining of a puncture wound to his left foot. Patient reports that he was walking through his yard with her tennis shoes when he stepped on a bolt or screw. Patient reports that it did penetrate through his shoe into the bottom of his left foot. Patient is reporting pain to the region. He is unsure of his last tetanus shot. No other injury or complaint.   Past Medical History:  Diagnosis Date  . Sleep apnea   . Syncope and collapse     Patient Active Problem List   Diagnosis Date Noted  . Faintness 07/14/2014    Past Surgical History:  Procedure Laterality Date  . denies      Prior to Admission medications   Medication Sig Start Date End Date Taking? Authorizing Provider  amoxicillin (AMOXIL) 500 MG capsule Take 1 capsule (500 mg total) by mouth 3 (three) times daily. 03/29/15   [name], PA-C  amoxicillin-clavulanate (AUGMENTIN) 875-125 MG tablet Take 1 tablet by mouth 2 (two) times daily. 03/22/17   Malai Lady, Delorise Royals, PA-C  cyclobenzaprine (FLEXERIL) 5 MG tablet Take 1 tablet (5 mg total) by mouth 3 (three) times daily as needed for muscle spasms (Do NOT drive while taking this medication). 02/01/15   Gayla Doss, MD  HYDROcodone-acetaminophen (NORCO/VICODIN) 5-325 MG per tablet Take 1 tablet by mouth every 4 (four) hours as needed for moderate pain. 03/29/15   [name], PA-C  Multiple Vitamin (MULTIVITAMIN WITH MINERALS) TABS tablet Take 1 tablet by mouth daily.    [provider]  POTASSIUM PO Take 1 tablet by mouth daily.    [provider]    Allergies Bee venom and Tramadol  Family History   Problem Relation Age of Onset  . Hypertension Mother   . Hypertension Father     Social History Social History  Substance Use Topics  . Smoking status: Current Every Day Smoker    Packs/day: 1.00    Years: 10.00  . Smokeless tobacco: Not on file  . Alcohol use Yes     Review of Systems  Constitutional: No fever/chills Cardiovascular: no chest pain. Respiratory: no cough. No SOB. Musculoskeletal: Negative for musculoskeletal pain. Skin: Negative for rash, abrasions, lacerations, ecchymosis. Positive for puncture wound to the left foot Neurological: Negative for headaches, focal weakness or numbness. 10-point ROS otherwise negative.  ____________________________________________   PHYSICAL EXAM:  VITAL SIGNS: ED Triage Vitals  Enc Vitals Group     BP 03/22/17 1926 (!) 147/76     Pulse Rate 03/22/17 1926 70     Resp 03/22/17 1926 20     Temp 03/22/17 1926 98.7 F (37.1 C)     Temp Source 03/22/17 1926 Oral     SpO2 03/22/17 1926 97 %     Weight 03/22/17 1927 (!) 315 lb (142.9 kg)     Height 03/22/17 1927 5\' 11"  (1.803 m)     Head Circumference --      Peak Flow --      Pain Score --      Pain Loc --      Pain Edu? --      Excl. in GC? --  Constitutional: Alert and oriented. Well appearing and in no acute distress. Eyes: Conjunctivae are normal. PERRL. EOMI. Head: Atraumatic. ENT:      Ears:       Nose: No congestion/rhinnorhea.      Mouth/Throat: Mucous membranes are moist.  Neck: No stridor.    Cardiovascular: Normal rate, regular rhythm. Normal S1 and S2.  Good peripheral circulation. Respiratory: Normal respiratory effort without tachypnea or retractions. Lungs CTAB. Good air entry to the bases with no decreased or absent breath sounds. Musculoskeletal: Full range of motion to all extremities. No gross deformities appreciated.No visible deformity or gross edema noted to left foot upon direction. Puncture wound is noted to the plantar aspect of the  left foot. No visible foreign body. No bleeding. Area is tender to palpation. Dorsalis pedis pulse intact. Sensation intact 5 digits. Neurologic:  Normal speech and language. No gross focal neurologic deficits are appreciated.  Skin:  Skin is warm, dry and intact. No rash noted. See above note for puncture wound description to left foot. Psychiatric: Mood and affect are normal. Speech and behavior are normal. Patient exhibits appropriate insight and judgement.   ____________________________________________   LABS (all labs ordered are listed, but only abnormal results are displayed)  Labs Reviewed - No data to display ____________________________________________  EKG   ____________________________________________  RADIOLOGY Festus Barren Shahd Occhipinti, personally viewed and evaluated these images (plain radiographs) as part of my medical decision making, as well as reviewing the written report by the radiologist.  Dg Foot Complete Left  Result Date: 03/22/2017 CLINICAL DATA:  Left foot pain after stepping on a screw. Puncture on plantar foot. EXAM: LEFT FOOT - COMPLETE 3+ VIEW COMPARISON:  None. FINDINGS: There is no evidence of fracture or dislocation. There is no evidence of arthropathy or other focal bone abnormality. Site of puncture wound is not well seen radiographically. No radiopaque foreign body. No tracking soft tissue air. IMPRESSION: No acute osseous abnormality. Site of puncture wound not well seen radiographically. No radiopaque foreign body. Electronically Signed   By: Rubye Oaks M.D.   On: 03/22/2017 21:20    ____________________________________________    PROCEDURES  Procedure(s) performed:    Procedures    Medications  amoxicillin-clavulanate (AUGMENTIN) 875-125 MG per tablet 1 tablet (1 tablet Oral Given 03/22/17 2148)  Tdap (BOOSTRIX) injection 0.5 mL (0.5 mLs Intramuscular Given 03/22/17 2147)      ____________________________________________   INITIAL IMPRESSION / ASSESSMENT AND PLAN / ED COURSE  Pertinent labs & imaging results that were available during my care of the patient were reviewed by me and considered in my medical decision making (see chart for details).  Review of the [place] CSRS was performed in accordance of the [facility] prior to dispensing any controlled drugs.     Patient's diagnosis is consistent with puncture wound to the left foot. X-ray reveals no retained foreign body. Exam is reassuring with no indication of foreign body. Wound care structures are given the patient. He'll be placed on antibiotics prophylactically. Tetanus shot is updated at this time.. Patient will be discharged home with prescriptions for Augmentin. Patient is to follow up with primary care as needed or otherwise directed. Patient is given ED precautions to return to the ED for any worsening or new symptoms.     ____________________________________________  FINAL CLINICAL IMPRESSION(S) / ED DIAGNOSES  Final diagnoses:  Puncture wound of left foot, initial encounter      NEW MEDICATIONS STARTED DURING THIS VISIT:  Discharge Medication List as of  03/22/2017  9:43 PM    START taking these medications   Details  amoxicillin-clavulanate (AUGMENTIN) 875-125 MG tablet Take 1 tablet by mouth 2 (two) times daily., Starting Sun 03/22/2017, Print            This chart was dictated using voice recognition software/Dragon. Despite best efforts to proofread, errors can occur which can change the meaning. Any change was purely unintentional.    Racheal PatchesCuthriell, Aviannah Castoro D, PA-C 03/22/17 2228    Willy Eddyobinson, Patrick, MD 03/23/17 [id number]

## 2019-08-24 ENCOUNTER — Other Ambulatory Visit: Payer: Self-pay

## 2019-08-24 DIAGNOSIS — Z20822 Contact with and (suspected) exposure to covid-19: Secondary | ICD-10-CM

## 2019-08-27 LAB — NOVEL CORONAVIRUS, NAA: SARS-CoV-2, NAA: NOT DETECTED

## 2020-04-19 ENCOUNTER — Ambulatory Visit: Payer: Self-pay

## 2020-12-31 ENCOUNTER — Other Ambulatory Visit: Payer: Self-pay

## 2020-12-31 ENCOUNTER — Emergency Department: Payer: Self-pay

## 2020-12-31 ENCOUNTER — Emergency Department
Admission: EM | Admit: 2020-12-31 | Discharge: 2020-12-31 | Disposition: A | Discharge status: Home / Self Care | Payer: Self-pay | Attending: Emergency Medicine | Admitting: Emergency Medicine

## 2020-12-31 DIAGNOSIS — Y9389 Activity, other specified: Secondary | ICD-10-CM | POA: Insufficient documentation

## 2020-12-31 DIAGNOSIS — S61511A Laceration without foreign body of right wrist, initial encounter: Secondary | ICD-10-CM | POA: Insufficient documentation

## 2020-12-31 DIAGNOSIS — F17210 Nicotine dependence, cigarettes, uncomplicated: Secondary | ICD-10-CM | POA: Insufficient documentation

## 2020-12-31 DIAGNOSIS — W25XXXA Contact with sharp glass, initial encounter: Secondary | ICD-10-CM | POA: Insufficient documentation

## 2020-12-31 MED ORDER — CEPHALEXIN 500 MG PO CAPS: 500.0000 mg | ORAL_CAPSULE | Freq: Three times a day (TID) | ORAL | 0 refills | Status: AC

## 2020-12-31 MED ORDER — LIDOCAINE-EPINEPHRINE 2 %-1:100000 IJ SOLN
20.0000 mL | Freq: Once | INTRAMUSCULAR | Status: AC
  Administered 2020-12-31: 20 mL

## 2020-12-31 NOTE — ED Triage Notes (Signed)
1 inch lac to right hand, was working with glass.

## 2020-12-31 NOTE — Discharge Instructions (Signed)
Take Keflex three times daily for seven days.  Have sutures removed in one week.

## 2020-12-31 NOTE — ED Provider Notes (Signed)
[facility]-EMERGENCY DEPARTMENT  ____________________________________________  Time seen: Approximately 8:42 PM  I have reviewed the triage vital signs and the nursing notes.   HISTORY  Chief Complaint Laceration   Historian Patient    HPI William Rogers. is a 30 y.o. male presents to the emergency department with a 2-1/2 cm laceration along the dorsal aspect of the right wrist that was sustained accidentally while patient was working on a window.  Patient has been able to move all digits since injury occurred and he reports that his last tetanus shot was 2 weeks ago.  He denies numbness or tingling in the right.  No other alleviating measures have been attempted.    Past Medical History:  Diagnosis Date  . Sleep apnea   . Syncope and collapse      Immunizations up to date:  Yes.     Past Medical History:  Diagnosis Date  . Sleep apnea   . Syncope and collapse     Patient Active Problem List   Diagnosis Date Noted  . Faintness 07/14/2014    Past Surgical History:  Procedure Laterality Date  . denies      Prior to Admission medications   Medication Sig Start Date End Date Taking? Authorizing Provider  cephALEXin (KEFLEX) 500 MG capsule Take 1 capsule (500 mg total) by mouth 3 (three) times daily for 7 days. 12/31/20 01/07/21 Yes Pia Mau M, PA-C  amoxicillin (AMOXIL) 500 MG capsule Take 1 capsule (500 mg total) by mouth 3 (three) times daily. 03/29/15   [name], PA-C  amoxicillin-clavulanate (AUGMENTIN) 875-125 MG tablet Take 1 tablet by mouth 2 (two) times daily. 03/22/17   Cuthriell, Delorise Royals, PA-C  cyclobenzaprine (FLEXERIL) 5 MG tablet Take 1 tablet (5 mg total) by mouth 3 (three) times daily as needed for muscle spasms (Do NOT drive while taking this medication). 02/01/15   Gayla Doss, MD  HYDROcodone-acetaminophen (NORCO/VICODIN) 5-325 MG per tablet Take 1 tablet by mouth every 4 (four) hours as needed for moderate pain. 03/29/15   [name], PA-C  Multiple Vitamin (MULTIVITAMIN WITH MINERALS) TABS tablet Take 1 tablet by mouth daily.    [provider]  POTASSIUM PO Take 1 tablet by mouth daily.    [provider]    Allergies Bee venom and Tramadol  Family History  Problem Relation Age of Onset  . Hypertension Mother   . Hypertension Father     Social History Social History   Tobacco Use  . Smoking status: Current Every Day Smoker    Packs/day: 1.00    Years: 10.00    Pack years: 10.00  Substance Use Topics  . Alcohol use: Yes  . Drug use: No     Review of Systems  Constitutional: No fever/chills Eyes:  No discharge ENT: No upper respiratory complaints. Respiratory: no cough. No SOB/ use of accessory muscles to breath Gastrointestinal:   No nausea, no vomiting.  No diarrhea.  No constipation. Musculoskeletal: Patient has right wrist pain.  Skin: Patient has laceration.     ____________________________________________   PHYSICAL EXAM:  VITAL SIGNS: ED Triage Vitals  Enc Vitals Group     BP 12/31/20 2032 136/78     Pulse Rate 12/31/20 2032 (!) 112     Resp 12/31/20 2032 20     Temp 12/31/20 2032 98.4 F (36.9 C)     Temp Source 12/31/20 2032 Oral     SpO2 12/31/20 2032 98 %  Weight 12/31/20 2030 (!) 330 lb (149.7 kg)     Height 12/31/20 2030 5\' 9"  (1.753 m)     Head Circumference --      Peak Flow --      Pain Score 12/31/20 2030 9     Pain Loc --      Pain Edu? --      Excl. in GC? --      Constitutional: Alert and oriented. Well appearing and in no acute distress. Eyes: Conjunctivae are normal. PERRL. EOMI. Head: Atraumatic. ENT:      Nose: No congestion/rhinnorhea.      Mouth/Throat: Mucous membranes are moist.  Neck: No stridor.  No cervical spine tenderness to palpation. Cardiovascular: Normal rate, regular rhythm. Normal S1 and S2.  Good peripheral circulation. Respiratory: Normal respiratory effort without tachypnea or retractions. Lungs CTAB. Good air  entry to the bases with no decreased or absent breath sounds Gastrointestinal: Bowel sounds x 4 quadrants. Soft and nontender to palpation. No guarding or rigidity. No distention. Musculoskeletal: Full range of motion to all extremities. No obvious deformities noted Neurologic:  Normal for age. No gross focal neurologic deficits are appreciated.  Skin: Patient has 2 and half centimeter laceration along the dorsal aspect of the right wrist deep to underlying adipose tissue. Psychiatric: Mood and affect are normal for age. Speech and behavior are normal.   ____________________________________________   LABS (all labs ordered are listed, but only abnormal results are displayed)  Labs Reviewed - No data to display ____________________________________________  EKG   ____________________________________________  RADIOLOGY 03/02/21, personally viewed and evaluated these images (plain radiographs) as part of my medical decision making, as well as reviewing the written report by the radiologist.    DG Wrist Complete Right  Result Date: 12/31/2020 CLINICAL DATA:  Laceration from broken glass EXAM: RIGHT WRIST - COMPLETE 3+ VIEW COMPARISON:  None. FINDINGS: No fracture or malalignment. Laceration radial side of the wrist. No radiopaque foreign body IMPRESSION: 1. No acute osseous abnormality. 2. No radiopaque foreign body. Electronically Signed   By: 03/02/2021 M.D.   On: 12/31/2020 21:08    ____________________________________________    PROCEDURES  Procedure(s) performed:     06/11/2022Marland KitchenLaceration Repair  Date/Time: 12/31/2020 8:44 PM Performed by: 03/02/2021, PA-C Authorized by: Orvil Feil, PA-C   Consent:    Consent obtained:  Verbal   Consent given by:  Patient   Risks discussed:  Infection and pain Universal protocol:    Procedure explained and questions answered to patient or proxy's satisfaction: yes     Patient identity confirmed:  Verbally with  patient Anesthesia:    Anesthesia method:  None Laceration details:    Location:  Shoulder/arm   Shoulder/arm location:  R lower arm   Length (cm):  2.5   Depth (mm):  5 Exploration:    Limited defect created (wound extended): yes     Wound exploration: wound explored through full range of motion     Contaminated: no   Treatment:    Area cleansed with:  Povidone-iodine   Amount of cleaning:  Standard   Irrigation solution:  Sterile saline   Visualized foreign bodies/material removed: no   Skin repair:    Repair method:  Sutures   Suture size:  4-0   Suture material:  Nylon   Suture technique:  Running locked   Number of sutures:  7 Approximation:    Approximation:  Close Repair type:    Repair type:  Simple Post-procedure details:    Dressing:  Non-adherent dressing   Procedure completion:  Tolerated well, no immediate complications       Medications  lidocaine-EPINEPHrine (XYLOCAINE W/EPI) 2 %-1:100000 (with pres) injection 20 mL (has no administration in time range)     ____________________________________________   INITIAL IMPRESSION / ASSESSMENT AND PLAN / ED COURSE  Pertinent labs & imaging results that were available during my care of the patient were reviewed by me and considered in my medical decision making (see chart for details).      Assessment and Plan: Laceration  30 year old male presents to the emergency department with a right wrist laceration repaired in the emergency department without complication.  There is no bony abnormality or retained glass foreign bodies on x-ray.  Patient was advised to have sutures removed by primary care in 7 days.  He was discharged with Keflex.  Tetanus status is already up-to-date.  All patient questions were answered.    ____________________________________________  FINAL CLINICAL IMPRESSION(S) / ED DIAGNOSES  Final diagnoses:  Laceration of right wrist, initial encounter      NEW MEDICATIONS STARTED  DURING THIS VISIT:  ED Discharge Orders         Ordered    cephALEXin (KEFLEX) 500 MG capsule  3 times daily        12/31/20 2144              This chart was dictated using voice recognition software/Dragon. Despite best efforts to proofread, errors can occur which can change the meaning. Any change was purely unintentional.     Gasper Lloyd 12/31/20 2145    Chesley Noon, MD 01/01/21 1705

## 2020-12-31 NOTE — ED Notes (Signed)
Pt was taking a window out when he got cut on right wrist. Laceration is about 1.5 inches long, bleeding has stopped. Pt is UTD on tetanus vaccine.

## 2021-05-27 ENCOUNTER — Other Ambulatory Visit: Payer: Self-pay

## 2021-05-27 ENCOUNTER — Emergency Department: Payer: Self-pay

## 2021-05-27 ENCOUNTER — Emergency Department
Admission: EM | Admit: 2021-05-27 | Discharge: 2021-05-27 | Disposition: A | Discharge status: Home / Self Care | Payer: Self-pay | Attending: Emergency Medicine | Admitting: Emergency Medicine

## 2021-05-27 DIAGNOSIS — N23 Unspecified renal colic: Secondary | ICD-10-CM

## 2021-05-27 DIAGNOSIS — F17200 Nicotine dependence, unspecified, uncomplicated: Secondary | ICD-10-CM | POA: Insufficient documentation

## 2021-05-27 DIAGNOSIS — N200 Calculus of kidney: Secondary | ICD-10-CM | POA: Insufficient documentation

## 2021-05-27 LAB — CBC
HCT: 41.3 % (ref 39.0–52.0)
Hemoglobin: 14.8 g/dL (ref 13.0–17.0)
MCH: 29.8 pg (ref 26.0–34.0)
MCHC: 35.8 g/dL (ref 30.0–36.0)
MCV: 83.1 fL (ref 80.0–100.0)
Platelets: 303 10*3/uL (ref 150–400)
RBC: 4.97 MIL/uL (ref 4.22–5.81)
RDW: 12.4 % (ref 11.5–15.5)
WBC: 10.6 10*3/uL — ABNORMAL HIGH (ref 4.0–10.5)
nRBC: 0 % (ref 0.0–0.2)

## 2021-05-27 LAB — COMPREHENSIVE METABOLIC PANEL
ALT: 45 U/L — ABNORMAL HIGH (ref 0–44)
AST: 29 U/L (ref 15–41)
Albumin: 3.9 g/dL (ref 3.5–5.0)
Alkaline Phosphatase: 57 U/L (ref 38–126)
Anion gap: 3 — ABNORMAL LOW (ref 5–15)
BUN: 13 mg/dL (ref 6–20)
CO2: 27 mmol/L (ref 22–32)
Calcium: 8.8 mg/dL — ABNORMAL LOW (ref 8.9–10.3)
Chloride: 107 mmol/L (ref 98–111)
Creatinine, Ser: 0.78 mg/dL (ref 0.61–1.24)
GFR, Estimated: >60 mL/min (ref >60)
Glucose, Bld: 117 mg/dL — ABNORMAL HIGH (ref 70–99)
Potassium: 4.1 mmol/L (ref 3.5–5.1)
Sodium: 137 mmol/L (ref 135–145)
Total Bilirubin: 1.1 mg/dL (ref 0.3–1.2)
Total Protein: 7 g/dL (ref 6.5–8.1)

## 2021-05-27 LAB — URINALYSIS, COMPLETE (UACMP) WITH MICROSCOPIC
Bacteria, UA: NONE SEEN
Bilirubin Urine: NEGATIVE
Glucose, UA: NEGATIVE mg/dL
Nitrite: NEGATIVE
RBC / HPF: >50 RBC/hpf (ref 0–5)
Specific Gravity, Urine: 1.025 (ref 1.005–1.030)
Squamous Epithelial / LPF: NONE SEEN (ref 0–5)
pH: 7 (ref 5.0–8.0)

## 2021-05-27 LAB — LIPASE, BLOOD: Lipase: 34 U/L (ref 11–51)

## 2021-05-27 MED ORDER — KETOROLAC TROMETHAMINE 30 MG/ML IJ SOLN
30.0000 mg | Freq: Once | INTRAMUSCULAR | Status: AC
  Administered 2021-05-27: 30 mg via INTRAVENOUS
  Filled 2021-05-27: qty 1

## 2021-05-27 MED ORDER — ONDANSETRON HCL 4 MG/2ML IJ SOLN
4.0000 mg | Freq: Once | INTRAMUSCULAR | Status: AC
  Administered 2021-05-27: 4 mg via INTRAVENOUS
  Filled 2021-05-27: qty 2

## 2021-05-27 MED ORDER — MORPHINE SULFATE (PF) 4 MG/ML IV SOLN
6.0000 mg | Freq: Once | INTRAVENOUS | Status: AC
Start: 2021-05-27 — End: 2021-05-27
  Administered 2021-05-27: 6 mg via INTRAVENOUS
  Filled 2021-05-27: qty 2

## 2021-05-27 MED ORDER — OXYCODONE-ACETAMINOPHEN 5-325 MG PO TABS: 1.0000 | ORAL_TABLET | ORAL | 0 refills | Status: AC | PRN

## 2021-05-27 NOTE — Discharge Instructions (Addendum)
You have a kidney stone on the right side.  It is very small and almost out into the bladder.  It should pass soon and then you probably will be able to pee it out.  Please strain all your urine and try to catch the stone.  Please follow-up with [facility] urological Dr. Mena Goes is on-call I given him your phone number.  Take the stone with you when you go.  If you call the office and tell he may have a kidney stone and they should be able to see you in the next week or 2.  Please return here for increasing pain, fever or vomiting.  Use the Percocet 1-2 pills 4 times a day as needed for pain.  Again if that is not enough and you are having bad pain or fever or vomiting please return here.

## 2021-05-27 NOTE — ED Notes (Signed)
RN to bedside to introduce self to pt. Pt CAOx4 and in no distress. Pt has pain x 4 hours.

## 2021-05-27 NOTE — ED Triage Notes (Signed)
Pt reports that he had sudden onset of right sided abd pain starting 3 hours ago states pain radiates towards his right side, pt states walking makes it worse, sitting incorrectly makes it worse, states he picked his niece up and almost collapsed due to the pain. Pt is diaphoretic in triage

## 2021-05-27 NOTE — ED Provider Notes (Addendum)
[facility] Emergency Department Provider Note   ____________________________________________   Event Date/Time   First MD Initiated Contact with Patient 05/27/21 1451     (approximate)  I have reviewed the triage vital signs and the nursing notes.   HISTORY  Chief Complaint Abdominal Pain   HPI William Rogers. is a 30 y.o. male patient with sudden onset right-sided pain starting about 3 hours ago.  It is worse with movement.  It is worse with palpation.  Patient sweaty.  He is not having any nausea vomiting or dysuria however.  There is blood in his urine.  Patient has not had this before.  Pain is severe.  It is sharp.        Past Medical History:  Diagnosis Date   Sleep apnea    Syncope and collapse     Patient Active Problem List   Diagnosis Date Noted   Faintness 07/14/2014    Past Surgical History:  Procedure Laterality Date   denies      Prior to Admission medications   Medication Sig Start Date End Date Taking? Authorizing Provider  oxyCODONE-acetaminophen (PERCOCET) 5-325 MG tablet Take 1 tablet by mouth every 4 (four) hours as needed for severe pain. 05/27/21 05/27/22 Yes Arnaldo Natal, MD  amoxicillin (AMOXIL) 500 MG capsule Take 1 capsule (500 mg total) by mouth 3 (three) times daily. 03/29/15   [name], PA-C  amoxicillin-clavulanate (AUGMENTIN) 875-125 MG tablet Take 1 tablet by mouth 2 (two) times daily. 03/22/17   Cuthriell, Delorise Royals, PA-C  cyclobenzaprine (FLEXERIL) 5 MG tablet Take 1 tablet (5 mg total) by mouth 3 (three) times daily as needed for muscle spasms (Do NOT drive while taking this medication). 02/01/15   Gayla Doss, MD  HYDROcodone-acetaminophen (NORCO/VICODIN) 5-325 MG per tablet Take 1 tablet by mouth every 4 (four) hours as needed for moderate pain. 03/29/15   [name], PA-C  Multiple Vitamin (MULTIVITAMIN WITH MINERALS) TABS tablet Take 1 tablet by mouth daily.    [provider]   POTASSIUM PO Take 1 tablet by mouth daily.    [provider]    Allergies Bee venom and Tramadol  Family History  Problem Relation Age of Onset   Hypertension Mother    Hypertension Father     Social History Social History   Tobacco Use   Smoking status: Every Day    Packs/day: 1.00    Years: 10.00    Pack years: 10.00    Types: Cigarettes  Substance Use Topics   Alcohol use: Yes   Drug use: No    Review of Systems  Constitutional: No fever/chills Eyes: No visual changes. ENT: No sore throat. Cardiovascular: Denies chest pain. Respiratory: Denies shortness of breath. Gastrointestinal: abdominal pain.  No nausea, no vomiting.  No diarrhea.  No constipation. Genitourinary: Negative for dysuria. Musculoskeletal: Negative for back pain. Skin: Negative for rash. Neurological: Negative for headaches, focal weakness   ____________________________________________   PHYSICAL EXAM:  VITAL SIGNS: ED Triage Vitals  Enc Vitals Group     BP 05/27/21 1426 (!) 154/93     Pulse Rate 05/27/21 1426 70     Resp 05/27/21 1426 18     Temp 05/27/21 1426 97.9 F (36.6 C)     Temp Source 05/27/21 1426 Oral     SpO2 05/27/21 1426 98 %     Weight 05/27/21 1427 (!) 330 lb (149.7 kg)     Height 05/27/21 1427  5\' 11"  (1.803 m)     Head Circumference --      Peak Flow --      Pain Score 05/27/21 1426 10     Pain Loc --      Pain Edu? --      Excl. in GC? --     Constitutional: Alert and oriented.  Patient is in pain. Eyes: Conjunctivae are normal.  Head: Atraumatic. Nose: No congestion/rhinnorhea. Mouth/Throat: Mucous membranes are moist.  Oropharynx non-erythematous. Neck: No stridor.  Cardiovascular: Normal rate, regular rhythm. Grossly normal heart sounds.  Good peripheral circulation. Respiratory: Normal respiratory effort.  No retractions. Lungs CTAB. Gastrointestinal: Soft tender to palpation percussion on the right lower quadrant no distention. No abdominal  bruits.  Mild right CVA tenderness. Genitourinary: Deferred Musculoskeletal: No lower extremity tenderness nor edema.  No joint effusions. Neurologic:  Normal speech and language. No gross focal neurologic deficits are appreciated.  Skin:  Skin is warm, dry and intact. No rash noted.   ____________________________________________   LABS (all labs ordered are listed, but only abnormal results are displayed)  Labs Reviewed  COMPREHENSIVE METABOLIC PANEL - Abnormal; Notable for the following components:      Result Value   Glucose, Bld 117 (*)    Calcium 8.8 (*)    ALT 45 (*)    Anion gap 3 (*)    All other components within normal limits  CBC - Abnormal; Notable for the following components:   WBC 10.6 (*)    All other components within normal limits  URINALYSIS, COMPLETE (UACMP) WITH MICROSCOPIC - Abnormal; Notable for the following components:   APPearance CLOUDY (*)    Hgb urine dipstick LARGE (*)    Ketones, ur TRACE (*)    Protein, ur TRACE (*)    Leukocytes,Ua TRACE (*)    All other components within normal limits  LIPASE, BLOOD   ____________________________________________  EKG   ____________________________________________  RADIOLOGY 07/27/21, personally viewed and evaluated these images (plain radiographs) as part of my medical decision making, as well as reviewing the written report by the radiologist.  ED MD interpretation:    Official radiology report(s): CT Renal Stone Study  Result Date: 05/27/2021 CLINICAL DATA:  Pt reports that he had sudden onset of right sided abd pain EXAM: CT ABDOMEN AND PELVIS WITHOUT CONTRAST TECHNIQUE: Multidetector CT imaging of the abdomen and pelvis was performed following the standard protocol without IV contrast. COMPARISON:  None. FINDINGS: Lower chest: No acute abnormality. Evaluation of the abdominal viscera somewhat limited by the lack of IV contrast. Hepatobiliary: No focal liver abnormality is seen. Normal  appearance of the gallbladder. Pancreas: Unremarkable. No surrounding inflammatory changes. Spleen: Normal in size without focal abnormality. Adrenals/Urinary Tract: Adrenal glands are unremarkable. There is mild right hydronephrosis and ureterectasis secondary to a punctate calculus in the distal right ureter (series 5, image 102). No additional renal calculi identified. No left hydronephrosis. No renal mass. Urinary bladder is unremarkable. Stomach/Bowel: Stomach is within normal limits. Appendix appears normal. No evidence of bowel wall thickening, distention, or inflammatory changes. Vascular/Lymphatic: Vascular patency can not be assessed in the absence of IV contrast. No lymphadenopathy. Reproductive: Prostate is unremarkable. Other: No abdominal wall hernia or abnormality. No abdominopelvic ascites. Musculoskeletal: No acute or significant osseous findings. IMPRESSION: Mild right hydronephrosis and ureterectasis secondary to a punctate calculus in the distal right ureter. Electronically Signed   By: 07/27/2021 M.D.   On: 05/27/2021 15:12    ____________________________________________  PROCEDURES  Procedure(s) performed (including Critical Care):  Procedures   ____________________________________________   INITIAL IMPRESSION / ASSESSMENT AND PLAN / ED COURSE  ----------------------------------------- 3:46 PM on 05/27/2021 ----------------------------------------- Patient with renal colic.  Stone is small and distal.  We will try some Toradol.  If that works plan on letting him go.  There is no white cells to speak of in the urine.  He will follow-up with urology return if worse or for vomiting or fever.              ____________________________________________   FINAL CLINICAL IMPRESSION(S) / ED DIAGNOSES  Final diagnoses:  Renal colic on right side     ED Discharge Orders          Ordered    oxyCODONE-acetaminophen (PERCOCET) 5-325 MG tablet  Every 4 hours  PRN        05/27/21 1538             Note:  This document was prepared using Dragon voice recognition software and may include unintentional dictation errors.    Arnaldo Natal, MD 05/27/21 1546 ----------------------------------------- 4:12 PM on 05/27/2021 ----------------------------------------- Patient is doing well at this point.  I will discharge him.   Arnaldo Natal, MD 05/27/21 [phone]

## 2023-05-10 IMAGING — CT CT RENAL STONE PROTOCOL
2 of 4 series · 16 of 46 positions shown, 18 images · non-contrast
Comparison: None.

CLINICAL DATA: Pt reports that he had sudden onset of right sided
abd pain

EXAM:
CT ABDOMEN AND PELVIS WITHOUT CONTRAST
TECHNIQUE: Multidetector CT imaging of the abdomen and pelvis was performed
following the standard protocol without IV contrast.

[Series 2: stone full standard · axial · 0.98mm/px · z∈[-872,-352]mm · 13 of 114 slices shown, 15 images]
[im 5/114  soft-tissue]
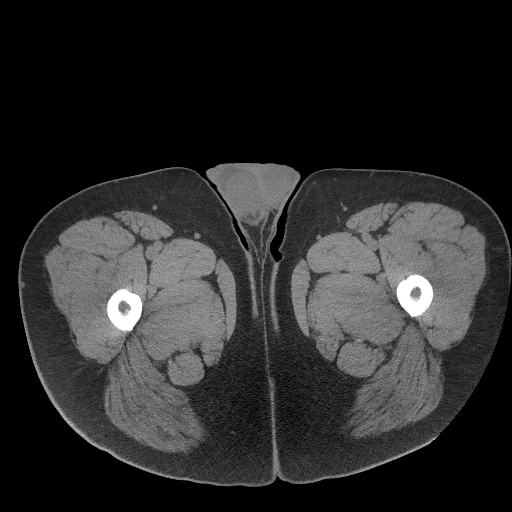
[im 5/114  bone]
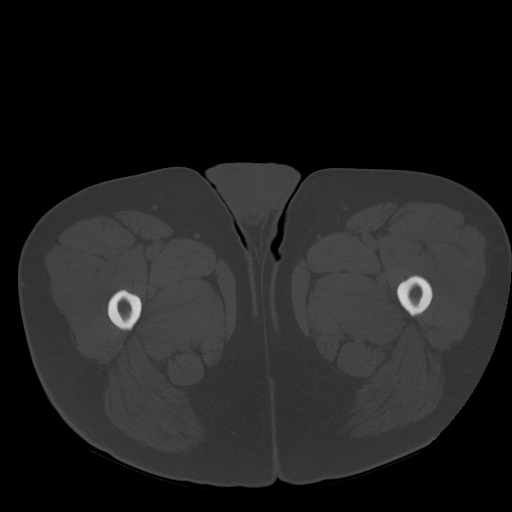
[im 14/114  soft-tissue]
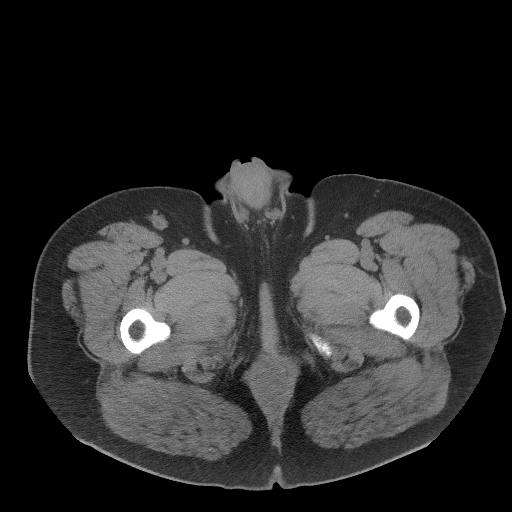
[im 22/114  soft-tissue]
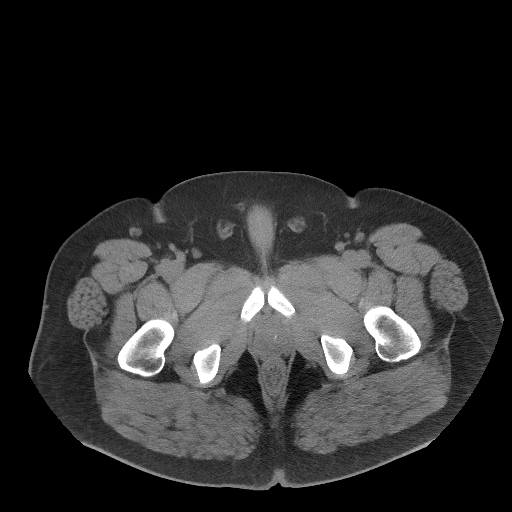
[im 31/114  soft-tissue]
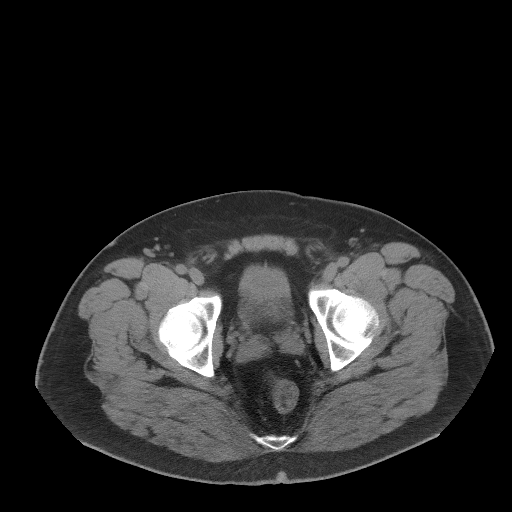
[im 40/114  soft-tissue]
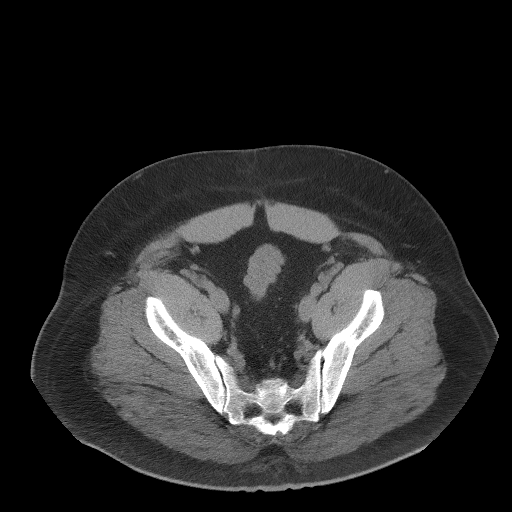
[im 48/114  soft-tissue]
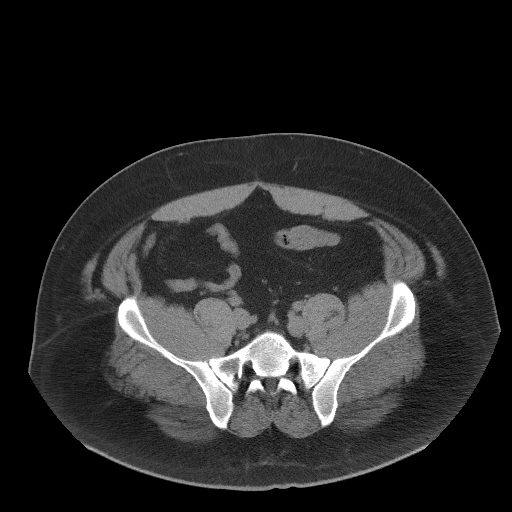
[im 57/114  soft-tissue]
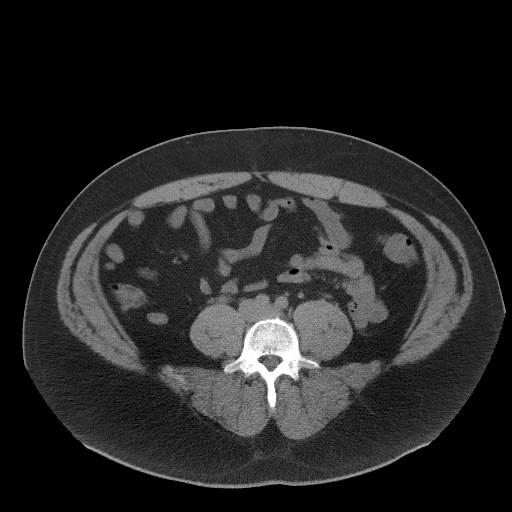
[im 66/114  soft-tissue]
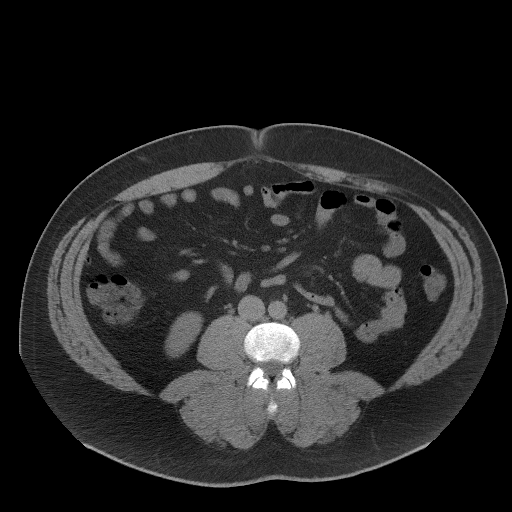
[im 74/114  soft-tissue]
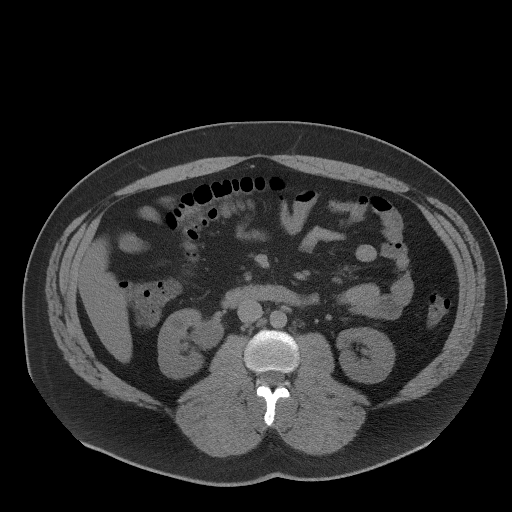
[im 74/114  bone]
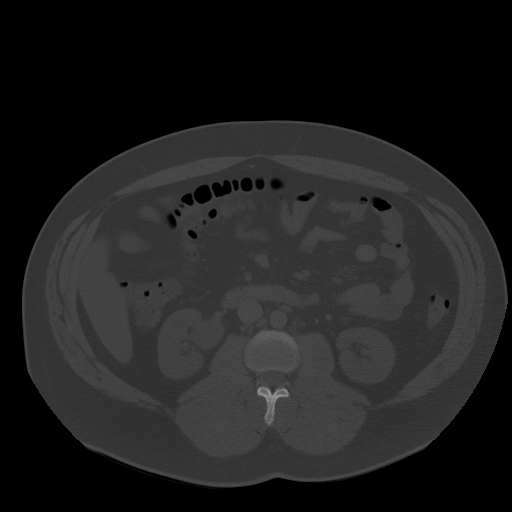
[im 83/114  soft-tissue]
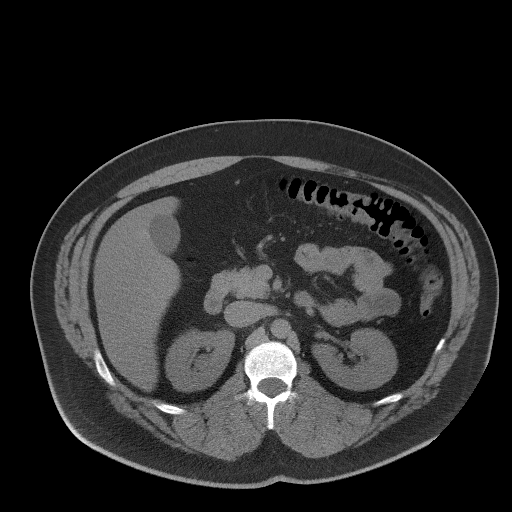
[im 92/114  soft-tissue]
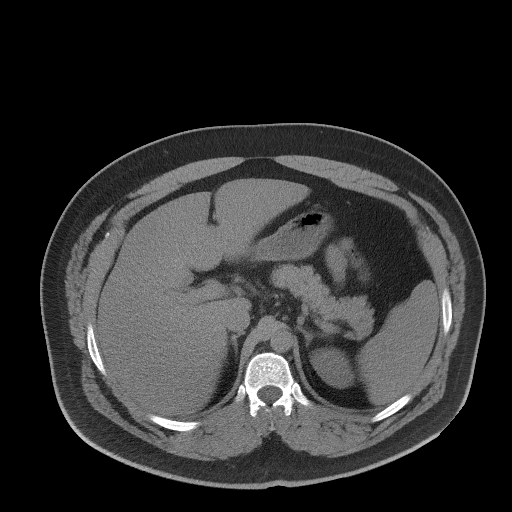
[im 100/114  soft-tissue]
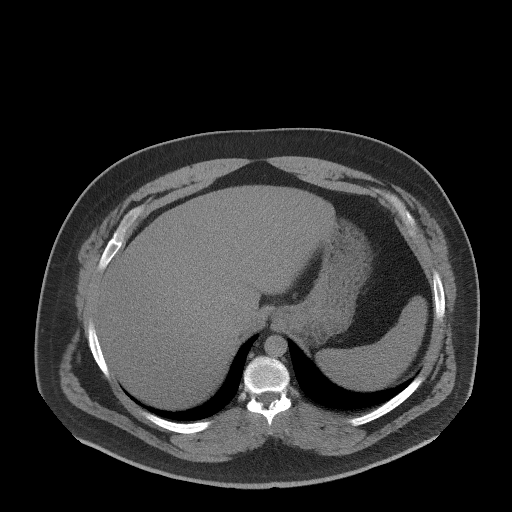
[im 109/114  soft-tissue]
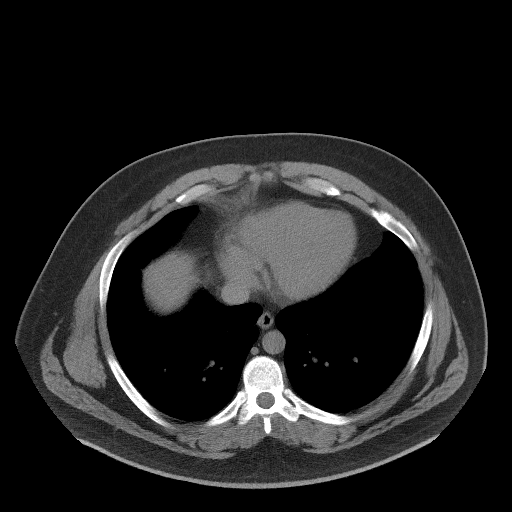

[Series 5: coronal · coronal · 0.95mm/px · 3 of 183 slices shown]
[im 61/183  soft-tissue]
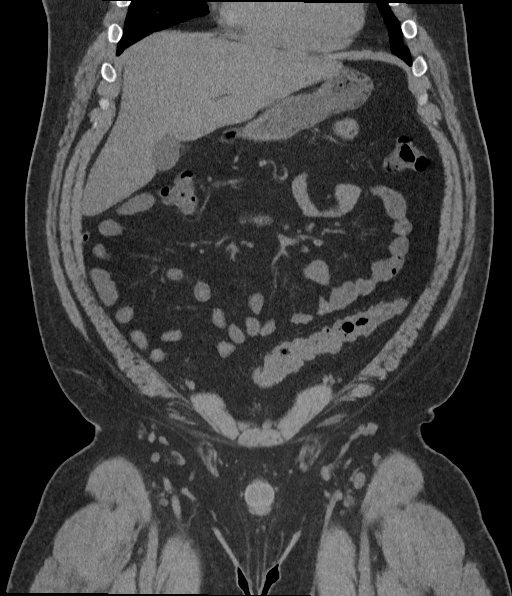
[im 81/183  soft-tissue]
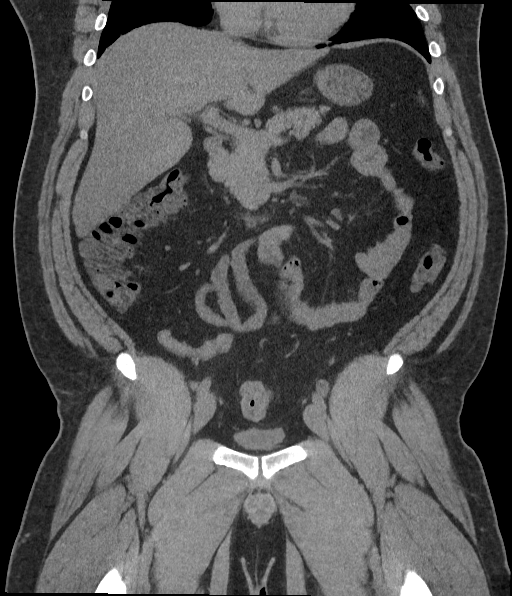
[im 102/183  soft-tissue]
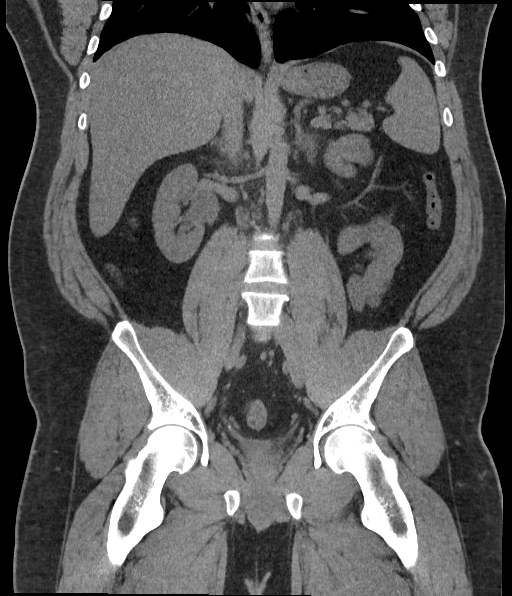

[16 of 46 positions shown; findings below may reference images not displayed]

FINDINGS: Lower chest: No acute abnormality.

Evaluation of the abdominal viscera somewhat limited by the lack of
IV contrast.

Hepatobiliary: No focal liver abnormality is seen. Normal appearance
of the gallbladder.

Pancreas: Unremarkable. No surrounding inflammatory changes.

Spleen: Normal in size without focal abnormality.

Adrenals/Urinary Tract: Adrenal glands are unremarkable. There is
mild right hydronephrosis and ureterectasis secondary to a punctate
calculus in the distal right ureter (series 5, image 102). No
additional renal calculi identified. No left hydronephrosis. No
renal mass. Urinary bladder is unremarkable.

Stomach/Bowel: Stomach is within normal limits. Appendix appears
normal. No evidence of bowel wall thickening, distention, or
inflammatory changes.

Vascular/Lymphatic: Vascular patency can not be assessed in the
absence of IV contrast. No lymphadenopathy.

Reproductive: Prostate is unremarkable.

Other: No abdominal wall hernia or abnormality. No abdominopelvic
ascites.

Musculoskeletal: No acute or significant osseous findings.
IMPRESSION: Mild right hydronephrosis and ureterectasis secondary to a punctate
calculus in the distal right ureter.
